# Patient Record
Sex: Male | Born: 1995 | Race: White | Hispanic: No | Marital: Single | Smoking: Never smoker
Health system: Southern US, Community
[De-identification: ages and names within clinical notes are randomized; demographics above are authoritative.]

## PROBLEM LIST (undated history)

## (undated) DIAGNOSIS — M7050 Other bursitis of knee, unspecified knee: Secondary | ICD-10-CM

## (undated) DIAGNOSIS — T1490XA Injury, unspecified, initial encounter: Secondary | ICD-10-CM

## (undated) HISTORY — DX: Injury, unspecified, initial encounter: T14.90XA

## (undated) HISTORY — PX: OTHER SURGICAL HISTORY: SHX169

## (undated) HISTORY — DX: Other bursitis of knee, unspecified knee: M70.50

---
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
## 2017-04-05 DIAGNOSIS — J029 Acute pharyngitis, unspecified: Secondary | ICD-10-CM | POA: Diagnosis not present

## 2017-04-05 DIAGNOSIS — R070 Pain in throat: Secondary | ICD-10-CM | POA: Diagnosis not present

## 2017-08-17 DIAGNOSIS — X509XXA Other and unspecified overexertion or strenuous movements or postures, initial encounter: Secondary | ICD-10-CM | POA: Diagnosis not present

## 2017-08-17 DIAGNOSIS — S92351A Displaced fracture of fifth metatarsal bone, right foot, initial encounter for closed fracture: Secondary | ICD-10-CM | POA: Diagnosis not present

## 2017-08-17 DIAGNOSIS — M7989 Other specified soft tissue disorders: Secondary | ICD-10-CM | POA: Diagnosis not present

## 2017-08-17 DIAGNOSIS — M79671 Pain in right foot: Secondary | ICD-10-CM | POA: Diagnosis not present

## 2017-08-17 DIAGNOSIS — X58XXXA Exposure to other specified factors, initial encounter: Secondary | ICD-10-CM | POA: Diagnosis not present

## 2017-08-22 DIAGNOSIS — S92301A Fracture of unspecified metatarsal bone(s), right foot, initial encounter for closed fracture: Secondary | ICD-10-CM | POA: Diagnosis not present

## 2017-08-22 DIAGNOSIS — S93491A Sprain of other ligament of right ankle, initial encounter: Secondary | ICD-10-CM | POA: Diagnosis not present

## 2017-08-29 DIAGNOSIS — S92354D Nondisplaced fracture of fifth metatarsal bone, right foot, subsequent encounter for fracture with routine healing: Secondary | ICD-10-CM | POA: Diagnosis not present

## 2017-08-29 DIAGNOSIS — S93491D Sprain of other ligament of right ankle, subsequent encounter: Secondary | ICD-10-CM | POA: Diagnosis not present

## 2017-09-06 DIAGNOSIS — S93491D Sprain of other ligament of right ankle, subsequent encounter: Secondary | ICD-10-CM | POA: Diagnosis not present

## 2017-09-07 DIAGNOSIS — S93491D Sprain of other ligament of right ankle, subsequent encounter: Secondary | ICD-10-CM | POA: Diagnosis not present

## 2017-09-07 DIAGNOSIS — S92354D Nondisplaced fracture of fifth metatarsal bone, right foot, subsequent encounter for fracture with routine healing: Secondary | ICD-10-CM | POA: Diagnosis not present

## 2017-09-19 DIAGNOSIS — S92354D Nondisplaced fracture of fifth metatarsal bone, right foot, subsequent encounter for fracture with routine healing: Secondary | ICD-10-CM | POA: Diagnosis not present

## 2017-09-19 DIAGNOSIS — S93491D Sprain of other ligament of right ankle, subsequent encounter: Secondary | ICD-10-CM | POA: Diagnosis not present

## 2017-09-26 DIAGNOSIS — S92354D Nondisplaced fracture of fifth metatarsal bone, right foot, subsequent encounter for fracture with routine healing: Secondary | ICD-10-CM | POA: Diagnosis not present

## 2017-09-26 DIAGNOSIS — S93491D Sprain of other ligament of right ankle, subsequent encounter: Secondary | ICD-10-CM | POA: Diagnosis not present

## 2017-10-04 DIAGNOSIS — S92354D Nondisplaced fracture of fifth metatarsal bone, right foot, subsequent encounter for fracture with routine healing: Secondary | ICD-10-CM | POA: Diagnosis not present

## 2017-10-04 DIAGNOSIS — S93491D Sprain of other ligament of right ankle, subsequent encounter: Secondary | ICD-10-CM | POA: Diagnosis not present

## 2017-10-10 DIAGNOSIS — S93491D Sprain of other ligament of right ankle, subsequent encounter: Secondary | ICD-10-CM | POA: Diagnosis not present

## 2017-10-10 DIAGNOSIS — S92354D Nondisplaced fracture of fifth metatarsal bone, right foot, subsequent encounter for fracture with routine healing: Secondary | ICD-10-CM | POA: Diagnosis not present

## 2020-03-10 ENCOUNTER — Ambulatory Visit: Payer: Self-pay | Attending: Internal Medicine

## 2020-03-10 ENCOUNTER — Ambulatory Visit: Payer: Self-pay

## 2020-03-10 DIAGNOSIS — Z23 Encounter for immunization: Secondary | ICD-10-CM

## 2020-03-10 NOTE — Progress Notes (Signed)
   Covid-19 Vaccination Clinic  Name:  Alexander Bush    MRN: [id number] DOB: Dec 31, 1995  03/10/2020  Mr. Alexander Bush was observed post Covid-19 immunization for 15 minutes without incident. He was provided with Vaccine Information Sheet and instruction to access the V-Safe system.   Mr. Alexander Bush was instructed to call 911 with any severe reactions post vaccine: Marland Kitchen Difficulty breathing  . Swelling of face and throat  . A fast heartbeat  . A bad rash all over body  . Dizziness and weakness   Immunizations Administered    Name Date Dose VIS Date Route   Pfizer COVID-19 Vaccine 03/10/2020  2:39 PM 0.3 mL 12/19/2018 Intramuscular   Manufacturer: [employer], [employer]   Lot: [id number]   NDC: [address]-3

## 2020-03-31 ENCOUNTER — Ambulatory Visit: Payer: Self-pay | Attending: Internal Medicine

## 2020-03-31 DIAGNOSIS — Z23 Encounter for immunization: Secondary | ICD-10-CM

## 2020-03-31 NOTE — Progress Notes (Signed)
   Covid-19 Vaccination Clinic  Name:  Karlis Cregg    MRN: [id number] DOB: 1995/11/10  03/31/2020  Mr. Bonawitz was observed post Covid-19 immunization for 15 minutes without incident. He was provided with Vaccine Information Sheet and instruction to access the V-Safe system.   Mr. Brame was instructed to call 911 with any severe reactions post vaccine: Marland Kitchen Difficulty breathing  . Swelling of face and throat  . A fast heartbeat  . A bad rash all over body  . Dizziness and weakness   Immunizations Administered    Name Date Dose VIS Date Route   Pfizer COVID-19 Vaccine 03/31/2020  1:38 PM 0.3 mL 12/19/2018 Intramuscular   Manufacturer: [employer], [employer]   Lot: [id number]   NDC: [address]-1

## 2020-06-27 DIAGNOSIS — G43909 Migraine, unspecified, not intractable, without status migrainosus: Secondary | ICD-10-CM | POA: Diagnosis not present

## 2021-03-30 DIAGNOSIS — Z0000 Encounter for general adult medical examination without abnormal findings: Secondary | ICD-10-CM | POA: Diagnosis not present

## 2021-04-06 DIAGNOSIS — Z1339 Encounter for screening examination for other mental health and behavioral disorders: Secondary | ICD-10-CM | POA: Diagnosis not present

## 2021-04-06 DIAGNOSIS — Z0000 Encounter for general adult medical examination without abnormal findings: Secondary | ICD-10-CM | POA: Diagnosis not present

## 2021-04-06 DIAGNOSIS — J310 Chronic rhinitis: Secondary | ICD-10-CM | POA: Diagnosis not present

## 2021-04-06 DIAGNOSIS — Z1331 Encounter for screening for depression: Secondary | ICD-10-CM | POA: Diagnosis not present
